# Patient Record
Sex: Female | Born: 1970 | Race: White | Hispanic: No | Marital: Married | Smoking: Current every day smoker
Health system: Southern US, Community
[De-identification: ages and names within clinical notes are randomized; demographics above are authoritative.]

## PROBLEM LIST (undated history)

## (undated) DIAGNOSIS — O24419 Gestational diabetes mellitus in pregnancy, unspecified control: Secondary | ICD-10-CM

## (undated) HISTORY — PX: TUBAL LIGATION: SHX77

## (undated) HISTORY — PX: WRIST SURGERY: SHX841

## (undated) HISTORY — DX: Gestational diabetes mellitus in pregnancy, unspecified control: O24.419

---

## 2005-06-12 ENCOUNTER — Emergency Department: Payer: Self-pay | Admitting: Internal Medicine

## 2005-06-12 ENCOUNTER — Other Ambulatory Visit: Payer: Self-pay

## 2006-05-27 ENCOUNTER — Emergency Department: Payer: Self-pay | Admitting: Emergency Medicine

## 2009-12-23 ENCOUNTER — Emergency Department: Payer: Self-pay | Admitting: Emergency Medicine

## 2010-07-27 ENCOUNTER — Emergency Department: Payer: Self-pay | Admitting: Emergency Medicine

## 2011-02-03 ENCOUNTER — Ambulatory Visit: Payer: Self-pay | Admitting: Family Medicine

## 2011-02-18 ENCOUNTER — Emergency Department: Payer: Self-pay | Admitting: Emergency Medicine

## 2011-08-08 ENCOUNTER — Ambulatory Visit: Payer: Self-pay

## 2012-08-14 ENCOUNTER — Ambulatory Visit: Payer: Self-pay

## 2013-01-15 ENCOUNTER — Emergency Department: Payer: Self-pay | Admitting: Emergency Medicine

## 2013-01-16 LAB — URINALYSIS, COMPLETE
Glucose,UR: NEGATIVE mg/dL (ref 0–75)
Leukocyte Esterase: NEGATIVE
Nitrite: NEGATIVE
Ph: 5 (ref 4.5–8.0)
RBC,UR: 10 /HPF (ref 0–5)
Squamous Epithelial: 1

## 2013-03-28 ENCOUNTER — Emergency Department: Payer: Self-pay | Admitting: Internal Medicine

## 2013-06-11 ENCOUNTER — Ambulatory Visit: Payer: Self-pay | Admitting: Family Medicine

## 2014-07-13 ENCOUNTER — Emergency Department: Payer: Self-pay | Admitting: Emergency Medicine

## 2016-02-22 DIAGNOSIS — M62838 Other muscle spasm: Secondary | ICD-10-CM

## 2016-02-23 ENCOUNTER — Encounter: Payer: Self-pay | Admitting: Unknown Physician Specialty

## 2016-02-23 ENCOUNTER — Ambulatory Visit (INDEPENDENT_AMBULATORY_CARE_PROVIDER_SITE_OTHER): Payer: BLUE CROSS/BLUE SHIELD | Admitting: Unknown Physician Specialty

## 2016-02-23 VITALS — BP 132/84 | HR 92 | Temp 98.9°F | Ht 63.0 in | Wt 187.2 lb

## 2016-02-23 DIAGNOSIS — M25512 Pain in left shoulder: Secondary | ICD-10-CM | POA: Diagnosis not present

## 2016-02-23 MED ORDER — METHYLPREDNISOLONE 4 MG PO TBPK: ORAL_TABLET | ORAL | Status: DC

## 2016-02-23 MED ORDER — TRAMADOL HCL 50 MG PO TABS
50.0000 mg | ORAL_TABLET | Freq: Three times a day (TID) | ORAL | Status: DC | PRN
Start: 2016-02-23 — End: 2018-02-22

## 2016-02-23 NOTE — Progress Notes (Signed)
BP 132/84 mmHg  Pulse 92  Temp(Src) 98.9 F (37.2 C)  Ht 5\' 3"  (1.6 m)  Wt 187 lb 3.2 oz (84.913 kg)  BMI 33.17 kg/m2  SpO2 100%  LMP 01/11/2016 (Approximate)   Subjective:    Patient ID: Elizabeth Lang, female    DOB: 10/02/1971, 45 y.o.   MRN: [id number]  HPI: Elizabeth Lang is a 45 y.o. female  Chief Complaint  Patient presents with  . Shoulder Pain    pt states she has had pain in left shoulder that has gotten worse within the last few months   This has been an ongoing and chronic problem for many years.  Last MRI done 9/12 showing bursitis.  X-ray of shoulder 8/15 was negative for any abnormalities.  She has not seen me for over a year.  In the last year ongoing pain in left shoulder, worse the last several months.  It is difficult to raise arm higher than shoulder height.  Taking Tylenol and Motrin without much benefit.  Better if holding arm at side like in a sling.  She did physical therapy for about 3 months several years ago.  She still does the exercises daily but it is no longer helping. Father died at the beginning of last year and got worse with lifting associated with taking care of estate.  I have injected it before with minimum improvement.    Relevant past medical, surgical, family and social history reviewed and updated as indicated. Interim medical history since our last visit reviewed. Allergies and medications reviewed and updated.  Review of Systems  Per HPI unless specifically indicated above     Objective:    BP 132/84 mmHg  Pulse 92  Temp(Src) 98.9 F (37.2 C)  Ht 5\' 3"  (1.6 m)  Wt 187 lb 3.2 oz (84.913 kg)  BMI 33.17 kg/m2  SpO2 100%  LMP 01/11/2016 (Approximate)  Wt Readings from Last 3 Encounters:  02/23/16 187 lb 3.2 oz (84.913 kg)  01/10/14 175 lb (79.379 kg)    Physical Exam  Constitutional: She is oriented to person, place, and time. She appears well-developed and well-nourished. No distress.  HENT:  Head: Normocephalic and atraumatic.   Eyes: Conjunctivae and lids are normal. Right eye exhibits no discharge. Left eye exhibits no discharge. No scleral icterus.  Neck: Normal range of motion. Neck supple. No JVD present. Carotid bruit is not present.  Cardiovascular: Normal rate, regular rhythm and normal heart sounds.   Pulmonary/Chest: Effort normal and breath sounds normal.  Abdominal: Normal appearance. There is no splenomegaly or hepatomegaly.  Musculoskeletal:       Left shoulder: She exhibits decreased range of motion, tenderness, swelling, pain and spasm. She exhibits no bony tenderness, no deformity and normal strength.  Neurological: She is alert and oriented to person, place, and time.  Skin: Skin is warm, dry and intact. No rash noted. No pallor.  Psychiatric: She has a normal mood and affect. Her behavior is normal. Judgment and thought content normal.    Assessment & Plan:   Problem List Items Addressed This Visit      Unprioritized   Left shoulder pain - Primary    Chronic problem of many years already treated with injection, PT, MRI x-ray.   Suspect bursitis/muscle spasm.  Unable to do another injection due to insurance denial.  Will refer to orthopedics.       Relevant Orders   Ambulatory referral to Orthopedic Surgery  Follow up plan: Return if symptoms worsen or fail to improve, for physical.

## 2016-02-23 NOTE — Assessment & Plan Note (Signed)
Chronic problem of many years already treated with injection, PT, MRI x-ray.   Suspect bursitis/muscle spasm.  Unable to do another injection due to insurance denial.  Will refer to orthopedics.

## 2016-02-25 ENCOUNTER — Telehealth: Payer: Self-pay

## 2016-02-25 NOTE — Telephone Encounter (Signed)
Tried calling patient to tell her she has an appointment at [facility] location) 03/04/2016 at 1:30 with PA Horris LatinoLance McGhee. No answer and no mailbox set up.

## 2016-02-26 NOTE — Telephone Encounter (Signed)
Letter sent.

## 2016-02-26 NOTE — Telephone Encounter (Signed)
Called patient but there was no answer and no voicemail set up. Will try to call again later.

## 2016-03-07 ENCOUNTER — Other Ambulatory Visit: Payer: Self-pay | Admitting: Student

## 2016-03-07 DIAGNOSIS — M5412 Radiculopathy, cervical region: Secondary | ICD-10-CM

## 2016-04-01 ENCOUNTER — Ambulatory Visit
Admission: RE | Admit: 2016-04-01 | Discharge: 2016-04-01 | Disposition: A | Discharge status: Home / Self Care | Payer: BLUE CROSS/BLUE SHIELD | Source: Ambulatory Visit | Attending: Student | Admitting: Student

## 2016-04-01 DIAGNOSIS — M4802 Spinal stenosis, cervical region: Secondary | ICD-10-CM | POA: Insufficient documentation

## 2016-04-01 DIAGNOSIS — M5412 Radiculopathy, cervical region: Secondary | ICD-10-CM | POA: Insufficient documentation

## 2016-04-01 DIAGNOSIS — M50223 Other cervical disc displacement at C6-C7 level: Secondary | ICD-10-CM | POA: Diagnosis not present

## 2016-04-01 DIAGNOSIS — M50222 Other cervical disc displacement at C5-C6 level: Secondary | ICD-10-CM | POA: Diagnosis not present

## 2016-05-02 ENCOUNTER — Other Ambulatory Visit: Payer: Self-pay

## 2016-05-03 ENCOUNTER — Encounter: Payer: BLUE CROSS/BLUE SHIELD | Admitting: Unknown Physician Specialty

## 2016-08-28 HISTORY — PX: NECK SURGERY: SHX720

## 2016-10-31 IMAGING — MR MR CERVICAL SPINE W/O CM
5 series · 32 of 48 positions shown · non-contrast
Comparison: None.

CLINICAL DATA: No surgery. No injury. No HA's. Neck pain with left
arm pain. Numbness. Tingling and burning in arm and hand. Some hand
weakness. Problems worse last year.

EXAM:
MRI CERVICAL SPINE WITHOUT CONTRAST
TECHNIQUE: Multiplanar, multisequence MR imaging of the cervical spine was
performed. No intravenous contrast was administered.

[Series 5: T2 · sagittal · 3.0mm · 0.56mm/px · 6 of 13 slices shown (1 of 2)]
[im 1/13]
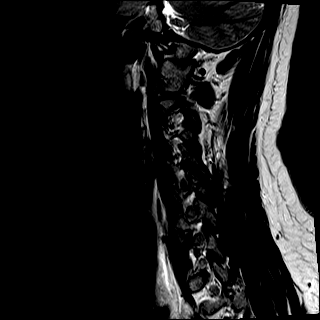
[im 3/13]
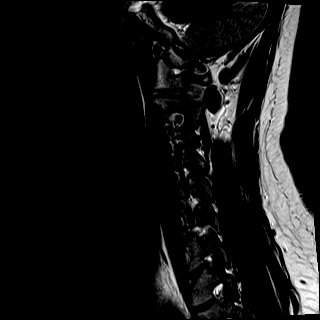
[im 5/13]
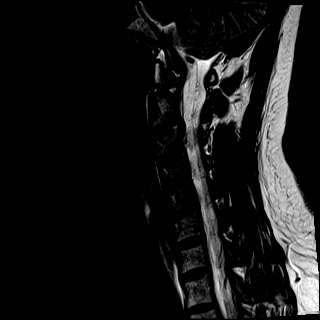
[im 8/13]
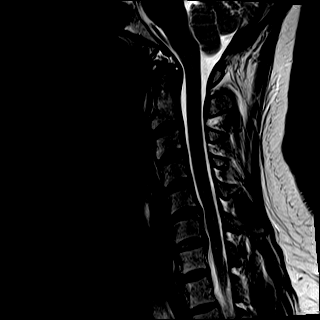
[im 10/13]
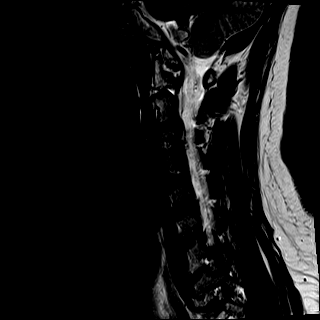
[im 13/13]
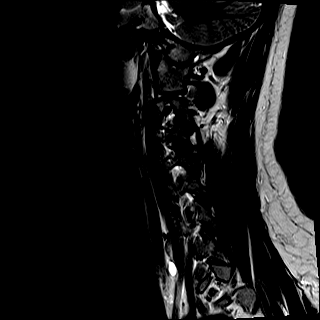

[Series 6: T1 · sagittal · 3.0mm · 0.70mm/px · 7 of 13 slices shown]
[im 1/13]
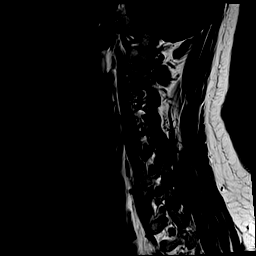
[im 3/13]
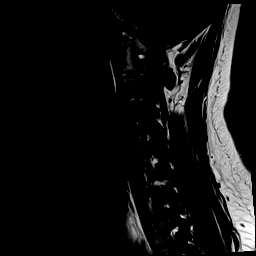
[im 5/13]
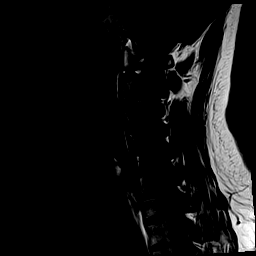
[im 7/13]
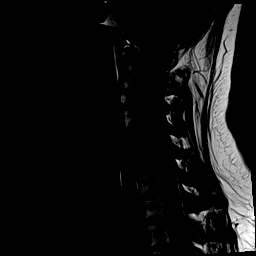
[im 9/13]
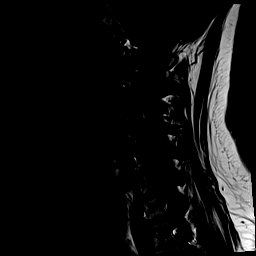
[im 11/13]
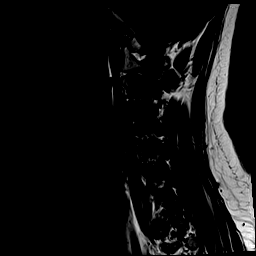
[im 13/13]
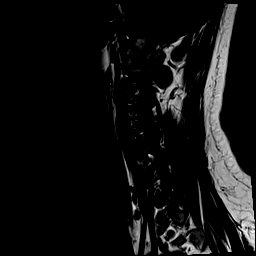

[Series 7: STIR · sagittal · 3.0mm · 0.35mm/px · 7 of 13 slices shown]
[im 1/13]
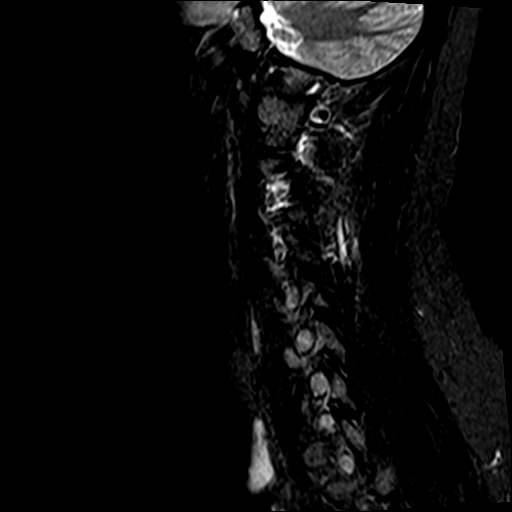
[im 3/13]
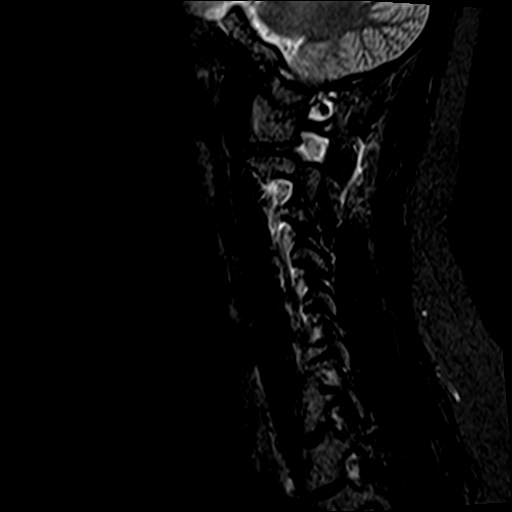
[im 5/13]
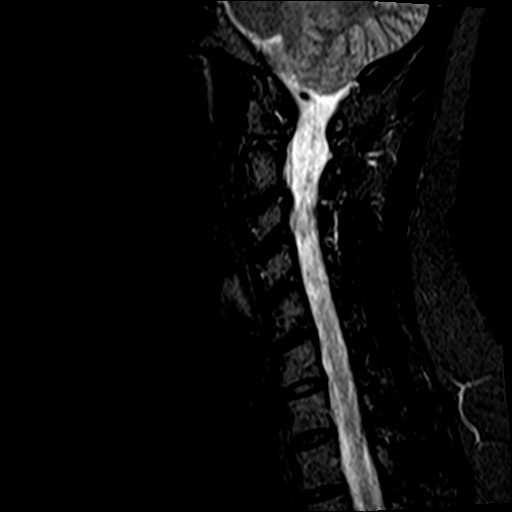
[im 7/13]
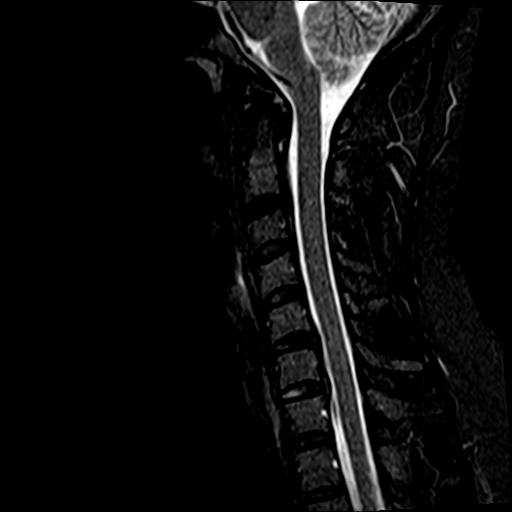
[im 9/13]
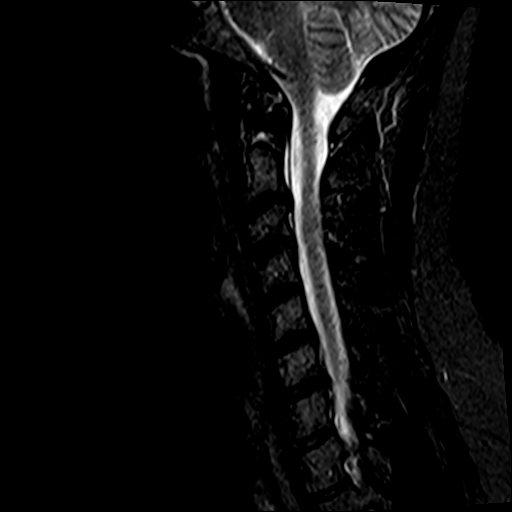
[im 11/13]
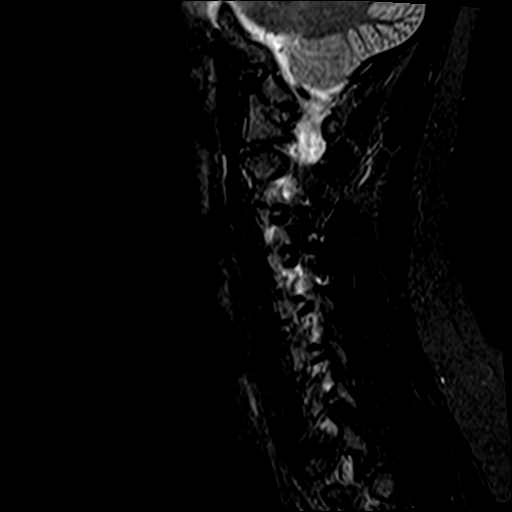
[im 13/13]
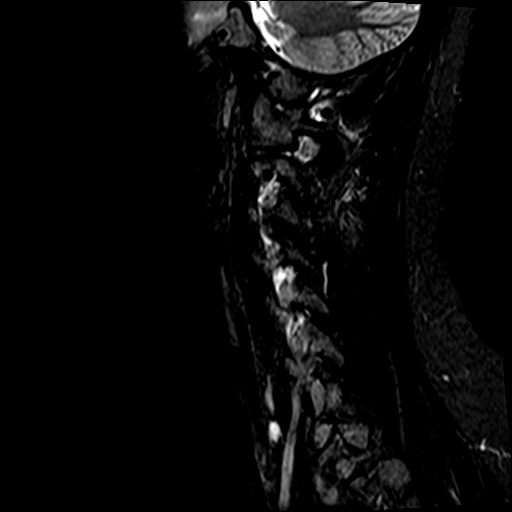

[Series 8: T2 · axial · 3.0mm · 0.62mm/px · z∈[-81,+22]mm · 8 of 28 slices shown (2 of 2)]
[im 1/28]
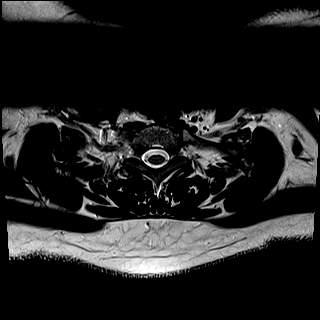
[im 5/28]
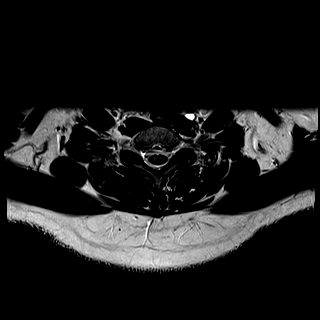
[im 9/28]
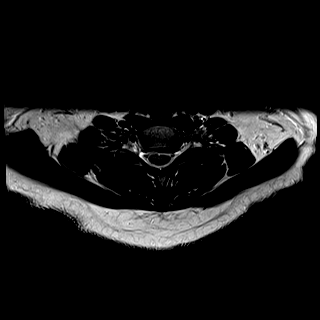
[im 13/28]
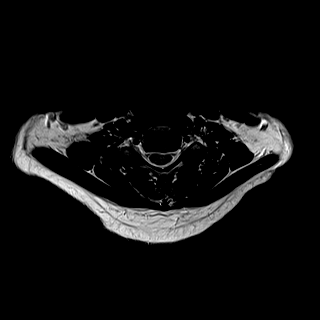
[im 15/28]
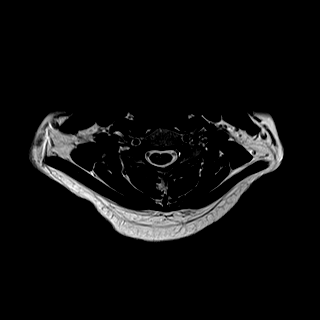
[im 19/28]
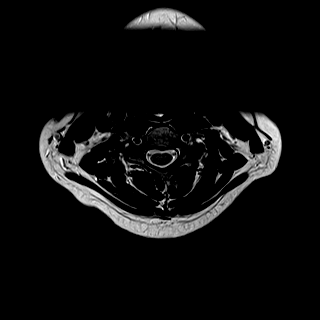
[im 23/28]
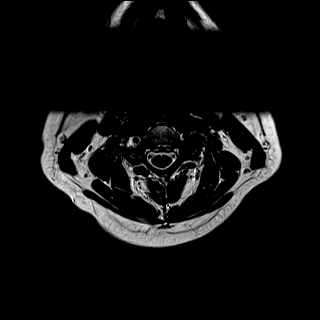
[im 28/28]
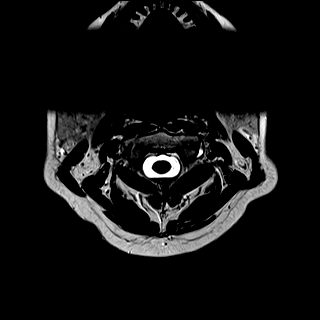

[Series 9: mpgr ax · axial · 3.0mm · 0.39mm/px · z∈[-81,-36]mm · 4 of 28 slices shown]
[im 1/28]
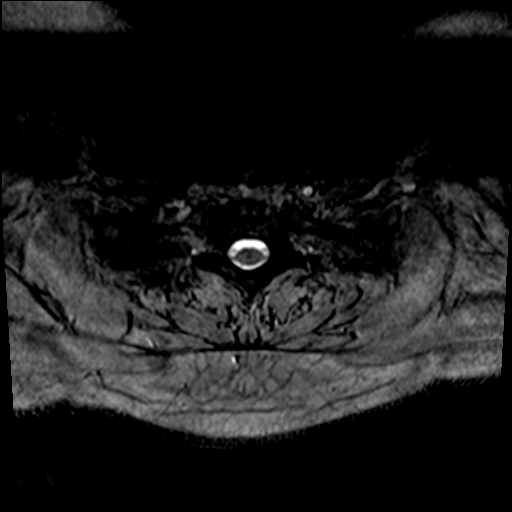
[im 5/28]
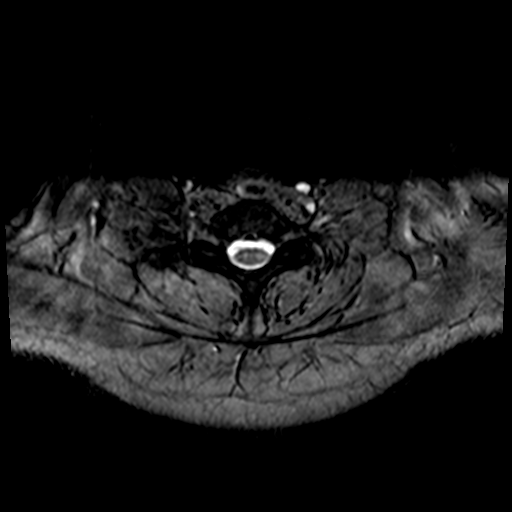
[im 9/28]
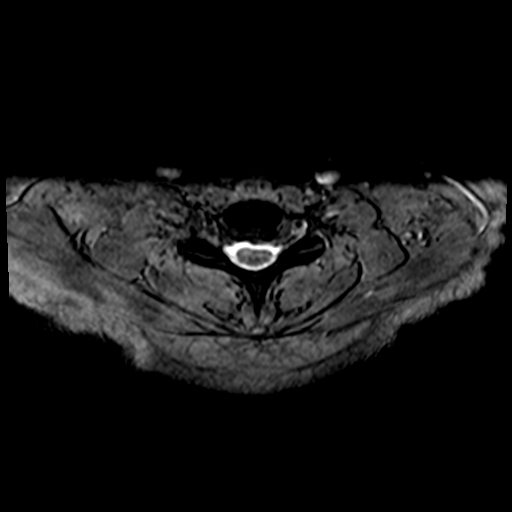
[im 13/28]
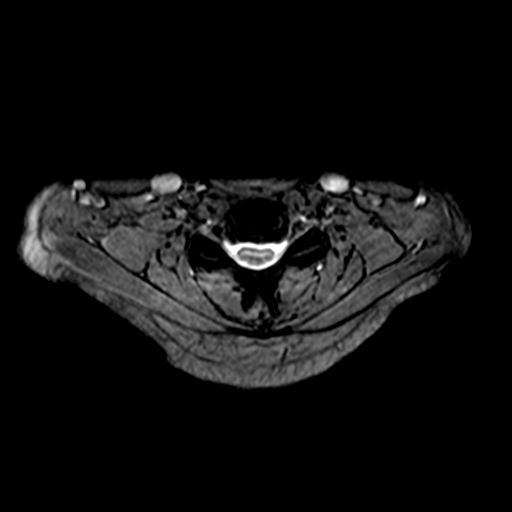

[32 of 48 positions shown; findings below may reference images not displayed]

FINDINGS: Alignment:  Normal cervical lordosis. No static listhesis.

Bones: Vertebral body heights are maintained. Bone marrow signal is
normal.

Spinal cord:  Cervical cord is normal in size and signal.

Posterior fossa: No focal abnormality.

Vessels:  Normal flow voids are present.

Paraspinal tissues:  No focal abnormality.

Disc levels:

Discs:  Disc spaces are maintained.

C2-3: No significant disc bulge. No neural foraminal stenosis. No
central canal stenosis.

C3-4: No significant disc bulge. Moderate left facet arthropathy
with mild left foraminal stenosis. No central canal stenosis.

C4-5: No significant disc bulge. No neural foraminal stenosis. No
central canal stenosis.

C5-6: Small left paracentral disc protrusion. No neural foraminal
stenosis. No central canal stenosis.

C6-7: Left paracentral/foraminal disc protrusion. Mild left
foraminal stenosis. No right foraminal stenosis. No central canal
stenosis.

C7-T1: No significant disc bulge. No neural foraminal stenosis. No
central canal stenosis.
IMPRESSION: 1. At C6-7, there is left paracentral/foraminal disc protrusion.
Mild left foraminal stenosis.

2. At C5-6, there is small left paracentral disc protrusion. Mild
left foraminal stenosis.

3. At C3-4, there is moderate left facet arthropathy with mild left
foraminal stenosis.

## 2018-02-22 ENCOUNTER — Encounter: Payer: Self-pay | Admitting: Family Medicine

## 2018-02-22 ENCOUNTER — Ambulatory Visit
Admission: RE | Admit: 2018-02-22 | Discharge: 2018-02-22 | Disposition: A | Discharge status: Home / Self Care | Payer: BLUE CROSS/BLUE SHIELD | Source: Ambulatory Visit | Attending: Family Medicine | Admitting: Family Medicine

## 2018-02-22 ENCOUNTER — Ambulatory Visit: Payer: BLUE CROSS/BLUE SHIELD | Admitting: Family Medicine

## 2018-02-22 VITALS — BP 145/91 | HR 75 | Temp 99.1°F | Ht 63.2 in | Wt 174.6 lb

## 2018-02-22 DIAGNOSIS — M542 Cervicalgia: Secondary | ICD-10-CM

## 2018-02-22 MED ORDER — CYCLOBENZAPRINE HCL 10 MG PO TABS: 10.0000 mg | ORAL_TABLET | Freq: Every evening | ORAL | 0 refills | Status: AC | PRN

## 2018-02-22 MED ORDER — TRAMADOL HCL 50 MG PO TABS: 50.0000 mg | ORAL_TABLET | Freq: Three times a day (TID) | ORAL | 0 refills | Status: AC | PRN

## 2018-02-22 MED ORDER — TRIAMCINOLONE ACETONIDE 40 MG/ML IJ SUSP
40.0000 mg | Freq: Once | INTRAMUSCULAR | Status: AC
  Administered 2018-02-22: 40 mg via INTRAMUSCULAR

## 2018-02-22 NOTE — Progress Notes (Addendum)
BP (!) 145/91   Pulse 75   Temp 99.1 F (37.3 C) (Oral)   Ht 5' 3.2" (1.605 m)   Wt 174 lb 9.6 oz (79.2 kg)   SpO2 100%   BMI 30.73 kg/m    Subjective:    Patient ID: Concha D Shaker, female    DOB: 07-21-1971, 47 y.o.   MRN: [id number]  HPI: Dayanna D Mis is a 47 y.o. female  Chief Complaint  Patient presents with  . Spasms    pt states she has been having spasms in her neck and upper back. States her hands are numb as well   Pt here with significant neck pain and spasms b/l the past week or so. Works on [employer] at [employer], certain tasks there cause severe upper back/neck spasms. Now having shooting pains down arms and b/l hand numbness. Heating pad and OTC pain relievers not helping. Hx of neck surgery in 2017 for cervical fusion, has hardware in place. Long hx of intermittent issues with muscle spasms. Denies loss of mobility of neck or arms, fevers, chills, redness, edema.   Relevant past medical, surgical, family and social history reviewed and updated as indicated. Interim medical history since our last visit reviewed. Allergies and medications reviewed and updated.  Review of Systems  Per HPI unless specifically indicated above     Objective:    BP (!) 145/91   Pulse 75   Temp 99.1 F (37.3 C) (Oral)   Ht 5' 3.2" (1.605 m)   Wt 174 lb 9.6 oz (79.2 kg)   SpO2 100%   BMI 30.73 kg/m   Wt Readings from Last 3 Encounters:  02/22/18 174 lb 9.6 oz (79.2 kg)  02/23/16 187 lb 3.2 oz (84.9 kg)  01/10/14 175 lb (79.4 kg)    Physical Exam  Constitutional: She is oriented to person, place, and time. She appears well-developed and well-nourished. No distress.  HENT:  Head: Atraumatic.  Right Ear: External ear normal.  Left Ear: External ear normal.  Nose: Nose normal.  Mouth/Throat: Oropharynx is clear and moist. No oropharyngeal exudate.  Eyes: Pupils are equal, round, and reactive to light. Conjunctivae are normal. No scleral icterus.  Neck: Normal range of  motion. Neck supple. No thyromegaly present.  Cardiovascular: Normal rate, regular rhythm, normal heart sounds and intact distal pulses.  Pulmonary/Chest: Effort normal and breath sounds normal. No respiratory distress.  Abdominal: Soft. Bowel sounds are normal. She exhibits no mass. There is no tenderness.  Musculoskeletal: Normal range of motion. She exhibits tenderness (TTP midline at cervical spine as well as b/l trapezius). She exhibits no edema.  B/l trapezius spasm  Lymphadenopathy:    She has no cervical adenopathy.  Neurological: She is alert and oriented to person, place, and time. No cranial nerve deficit.  Skin: Skin is warm and dry. No rash noted.  Psychiatric: She has a normal mood and affect. Her behavior is normal.  Nursing note and vitals reviewed.   No results found for this or any previous visit.    Assessment & Plan:   Problem List Items Addressed This Visit    None    Visit Diagnoses    Neck pain    -  Primary   IM kenalog today in office, will tx with flexeril, prn tramadol, heating pads, muscle rubs, and OTC pain relievers. Work note with restrictions given   Relevant Medications   triamcinolone acetonide (KENALOG-40) injection 40 mg (Completed)   Other Relevant Orders  DG Cervical Spine Complete (Completed)    Will get c-spine x-ray to ensure proper alignment still in place given radicular sxs but suspect at this point they're more from muscle spasm and inflammation   Follow up plan: No follow-ups on file.

## 2018-02-26 ENCOUNTER — Telehealth: Payer: Self-pay

## 2018-02-26 NOTE — Telephone Encounter (Signed)
Spoke with pt, her job will not allow her to come in with work restrictions as written in her work note so she is wanting clearance to return to full duty. Letter printed, signed, and up front for pick up tomorrow morning. She will come first thing and get it

## 2018-02-26 NOTE — Telephone Encounter (Signed)
Routing to [address].

## 2018-02-26 NOTE — Telephone Encounter (Signed)
Copied from CRM [phone]#[id number]. Topic: General - Other >> Feb 26, 2018  4:14 PM Debroah LoopLander, Lumin L wrote: Reason for CRM: Patient would like [employer] to call her before closing about giving her a Fit for Duty Clearance.

## 2018-02-27 NOTE — Telephone Encounter (Signed)
Letter picked up by patient.

## 2018-05-24 ENCOUNTER — Emergency Department
Admission: EM | Admit: 2018-05-24 | Discharge: 2018-05-24 | Disposition: A | Discharge status: Home / Self Care | Payer: BLUE CROSS/BLUE SHIELD | Attending: Emergency Medicine | Admitting: Emergency Medicine

## 2018-05-24 ENCOUNTER — Other Ambulatory Visit: Payer: Self-pay

## 2018-05-24 ENCOUNTER — Encounter: Payer: Self-pay | Admitting: Emergency Medicine

## 2018-05-24 DIAGNOSIS — T63441A Toxic effect of venom of bees, accidental (unintentional), initial encounter: Secondary | ICD-10-CM

## 2018-05-24 DIAGNOSIS — Y939 Activity, unspecified: Secondary | ICD-10-CM | POA: Insufficient documentation

## 2018-05-24 DIAGNOSIS — Y929 Unspecified place or not applicable: Secondary | ICD-10-CM | POA: Diagnosis not present

## 2018-05-24 DIAGNOSIS — X58XXXA Exposure to other specified factors, initial encounter: Secondary | ICD-10-CM | POA: Insufficient documentation

## 2018-05-24 DIAGNOSIS — F17210 Nicotine dependence, cigarettes, uncomplicated: Secondary | ICD-10-CM | POA: Diagnosis not present

## 2018-05-24 DIAGNOSIS — Y999 Unspecified external cause status: Secondary | ICD-10-CM | POA: Insufficient documentation

## 2018-05-24 DIAGNOSIS — S90561A Insect bite (nonvenomous), right ankle, initial encounter: Secondary | ICD-10-CM | POA: Diagnosis present

## 2018-05-24 MED ORDER — DEXAMETHASONE SODIUM PHOSPHATE 10 MG/ML IJ SOLN
10.0000 mg | Freq: Once | INTRAMUSCULAR | Status: AC
  Administered 2018-05-24: 10 mg via INTRAVENOUS
  Filled 2018-05-24: qty 1

## 2018-05-24 MED ORDER — FAMOTIDINE IN NACL 20-0.9 MG/50ML-% IV SOLN
20.0000 mg | Freq: Once | INTRAVENOUS | Status: AC
  Administered 2018-05-24: 20 mg via INTRAVENOUS
  Filled 2018-05-24: qty 50

## 2018-05-24 MED ORDER — DIPHENHYDRAMINE HCL 50 MG/ML IJ SOLN
25.0000 mg | Freq: Once | INTRAMUSCULAR | Status: AC
  Administered 2018-05-24: 25 mg via INTRAVENOUS
  Filled 2018-05-24: qty 1

## 2018-05-24 MED ORDER — EPINEPHRINE 0.3 MG/0.3ML IJ SOAJ: 0.3000 mg | Freq: Once | INTRAMUSCULAR | 1 refills | Status: AC

## 2018-05-24 NOTE — ED Triage Notes (Signed)
Arrives with a wasp sting to right ankle.  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  States did not have any Epi pen available.   Reaction is localized to right lateral ankle. Lungs CTA.  Voice clear and strong. NAD

## 2018-05-24 NOTE — ED Provider Notes (Signed)
[facility] Emergency Department Provider Note  ____________________________________________  Time seen: Approximately 6:30 PM  I have reviewed the triage vital signs and the nursing notes.   HISTORY  Chief Complaint Insect Bite   HPI Elizabeth Lang is a 47 y.o. female who presents to the emergency department for treatment and evaluation after being stung by a bee. Incident occurred "26 minutes ago." She reports that her "shock" are the symptoms she is now demonstrating which are stuttering and upper extremity shaking. She denies shortness of breath, lip or tongue swelling, or other symptoms of concern.  The sting is on the right lateral ankle. She did not use her Epi Pen because it is now out of date.  Past Medical History:  Diagnosis Date  . Gestational diabetes     Patient Active Problem List   Diagnosis Date Noted  . Left shoulder pain 02/23/2016  . Spasm of muscle 02/22/2016    Past Surgical History:  Procedure Laterality Date  . NECK SURGERY  08/2016  . TUBAL LIGATION    . WRIST SURGERY      Prior to Admission medications   Medication Sig Start Date End Date Taking? Authorizing Provider  cyclobenzaprine (FLEXERIL) 10 MG tablet Take 1 tablet (10 mg total) by mouth at bedtime as needed for muscle spasms. 02/22/18   Particia Nearing, PA-C  EPINEPHrine 0.3 mg/0.3 mL IJ SOAJ injection Inject 0.3 mLs (0.3 mg total) into the muscle once for 1 dose. 05/24/18 05/24/18  Taneasha Fuqua, Kasandra Knudsen, FNP  traMADol (ULTRAM) 50 MG tablet Take 1 tablet (50 mg total) by mouth every 8 (eight) hours as needed. 02/22/18   Particia Nearing, PA-C    Allergies Patient has no known allergies.  No family history on file.  Social History Social History   Tobacco Use  . Smoking status: Current Every Day Smoker    Packs/day: 0.25    Types: Cigarettes  . Smokeless tobacco: Never Used  Substance Use Topics  . Alcohol use: No  . Drug use: No    Review of  Systems  Constitutional: Negative for fever. Respiratory: Negative for cough or shortness of breath.  Musculoskeletal: Negative for myalgias Skin: Positive for bee sting on right ankle. Neurological: Negative for numbness or paresthesias. ____________________________________________   PHYSICAL EXAM:  VITAL SIGNS: ED Triage Vitals [05/24/18 1809]  Enc Vitals Group     BP (!) 157/76     Pulse Rate 88     Resp 18     Temp 98.6 F (37 C)     Temp Source Oral     SpO2 100 %     Weight 170 lb (77.1 kg)     Height 5\' 3"  (1.6 m)     Head Circumference      Peak Flow      Pain Score 8     Pain Loc      Pain Edu?      Excl. in GC?      Constitutional: Well appearing. Eyes: Conjunctivae are clear without discharge or drainage. Nose: No rhinorrhea noted. Mouth/Throat: Airway is patent. No tongue edema. Neck: No stridor. Unrestricted range of motion observed. Cardiovascular: Capillary refill is <3 seconds.  Respiratory: Respirations are even and unlabored. Breath sounds are clear throughout. Musculoskeletal: Unrestricted range of motion observed. Neurologic: Awake, alert, and oriented x 4.  Skin:  Focal erythema and edema of the right lateral ankle.  ____________________________________________   LABS (all labs ordered are listed, but only  abnormal results are displayed)  Labs Reviewed - No data to display ____________________________________________  EKG  Not indicated. ____________________________________________  RADIOLOGY  Not indicated. ____________________________________________   PROCEDURES  Procedures ____________________________________________   INITIAL IMPRESSION / ASSESSMENT AND PLAN / ED COURSE  Elizabeth Lang is a 47 y.o. female who presents to the emergency department for treatment and evaluation of what she describes as an anaphylactic reaction to a bee sting that occurred less than 30 minutes prior to arrival.  There was no evidence of  anaphylaxis and epinephrine is not warranted.  She did receive IV Decadron, Benadryl, and Pepcid which resolved her shaking and stuttering.  In case of a true allergic reaction which was described in detail, she will be given a epinephrine autoinjector prescription but was advised not to use that unless she begins to have shortness of breath, difficulty swallowing, facial swelling, or tongue swelling.  She verbalizes understanding.  She is encouraged to follow-up with the primary care provider for choice for symptoms that are not improving over the next few days.  Medications  diphenhydrAMINE (BENADRYL) injection 25 mg (25 mg Intravenous Given 05/24/18 1839)  dexamethasone (DECADRON) injection 10 mg (10 mg Intravenous Given 05/24/18 1839)  famotidine (PEPCID) IVPB 20 mg premix (0 mg Intravenous Stopped 05/24/18 1906)     Pertinent labs & imaging results that were available during my care of the patient were reviewed by me and considered in my medical decision making (see chart for details).  ____________________________________________   FINAL CLINICAL IMPRESSION(S) / ED DIAGNOSES  Final diagnoses:  Allergic reaction to bee sting    ED Discharge Orders        Ordered    EPINEPHrine 0.3 mg/0.3 mL IJ SOAJ injection   Once     05/24/18 2008       Note:  This document was prepared using Dragon voice recognition software and may include unintentional dictation errors.    Chinita Pesterriplett, Tujuana Kilmartin B, FNP 05/24/18 2135    Nita SickleVeronese, [place], MD 05/26/18 [id number]

## 2018-05-24 NOTE — ED Notes (Signed)
Pt assessed by provider at this time.

## 2018-05-24 NOTE — ED Notes (Signed)
Pt taken by family member to toilet using a wheelchair.

## 2020-02-23 ENCOUNTER — Ambulatory Visit: Payer: Self-pay | Attending: Internal Medicine

## 2020-02-23 DIAGNOSIS — Z23 Encounter for immunization: Secondary | ICD-10-CM

## 2020-02-23 NOTE — Progress Notes (Signed)
   Covid-19 Vaccination Clinic  Name:  Elizabeth Lang    MRN: [id number] DOB: 12/08/1970  02/23/2020  [name] was observed post Covid-19 immunization for 15 minutes without incident. She was provided with Vaccine Information Sheet and instruction to access the V-Safe system.   [name] was instructed to call 911 with any severe reactions post vaccine: Marland Kitchen Difficulty breathing  . Swelling of face and throat  . A fast heartbeat  . A bad rash all over body  . Dizziness and weakness   Immunizations Administered    Name Date Dose VIS Date Route   Pfizer COVID-19 Vaccine 02/23/2020  9:15 AM 0.3 mL 11/08/2019 Intramuscular   Manufacturer: [employer], [employer]   Lot: [id number]   NDC: [address]-1

## 2020-03-17 ENCOUNTER — Ambulatory Visit: Payer: Self-pay | Attending: Internal Medicine

## 2020-03-17 DIAGNOSIS — Z23 Encounter for immunization: Secondary | ICD-10-CM

## 2020-03-17 NOTE — Progress Notes (Signed)
   Covid-19 Vaccination Clinic  Name:  Elizabeth Lang    MRN: [id number] DOB: Apr 17, 1971  03/17/2020  Ms. [name] was observed post Covid-19 immunization for 30 minutes based on pre-vaccination screening without incident. She was provided with Vaccine Information Sheet and instruction to access the V-Safe system.   Ms. [name] was instructed to call 911 with any severe reactions post vaccine: Marland Kitchen Difficulty breathing  . Swelling of face and throat  . A fast heartbeat  . A bad rash all over body  . Dizziness and weakness   Immunizations Administered    Name Date Dose VIS Date Route   Pfizer COVID-19 Vaccine 03/17/2020  4:30 PM 0.3 mL 01/22/2019 Intramuscular   Manufacturer: [employer], [employer]   Lot: [id number]   NDC: [address]-4
